# Patient Record
Sex: Male | Born: 1970 | Race: Black or African American | Hispanic: No | Marital: Single | Smoking: Never smoker
Health system: Southern US, Community
[De-identification: ages and names within clinical notes are randomized; demographics above are authoritative.]

---

## 2019-05-10 ENCOUNTER — Encounter (HOSPITAL_COMMUNITY): Payer: Self-pay | Admitting: Emergency Medicine

## 2019-05-10 ENCOUNTER — Emergency Department (HOSPITAL_COMMUNITY)
Admission: EM | Admit: 2019-05-10 | Discharge: 2019-05-10 | Disposition: A | Discharge status: Home / Self Care | Payer: Self-pay | Attending: Emergency Medicine | Admitting: Emergency Medicine

## 2019-05-10 ENCOUNTER — Emergency Department (HOSPITAL_COMMUNITY): Payer: Self-pay

## 2019-05-10 ENCOUNTER — Other Ambulatory Visit: Payer: Self-pay

## 2019-05-10 DIAGNOSIS — R079 Chest pain, unspecified: Secondary | ICD-10-CM

## 2019-05-10 DIAGNOSIS — R0789 Other chest pain: Secondary | ICD-10-CM | POA: Insufficient documentation

## 2019-05-10 LAB — COMPREHENSIVE METABOLIC PANEL
ALT: 38 U/L (ref 0–44)
AST: 39 U/L (ref 15–41)
Albumin: 3.7 g/dL (ref 3.5–5.0)
Alkaline Phosphatase: 69 U/L (ref 38–126)
Anion gap: 9 (ref 5–15)
BUN: 15 mg/dL (ref 6–20)
CO2: 25 mmol/L (ref 22–32)
Calcium: 9.5 mg/dL (ref 8.9–10.3)
Chloride: 106 mmol/L (ref 98–111)
Creatinine, Ser: 1.33 mg/dL — ABNORMAL HIGH (ref 0.61–1.24)
GFR calc Af Amer: >60 mL/min (ref >60)
GFR calc non Af Amer: >60 mL/min (ref >60)
Glucose, Bld: 99 mg/dL (ref 70–99)
Potassium: 4.4 mmol/L (ref 3.5–5.1)
Sodium: 140 mmol/L (ref 135–145)
Total Bilirubin: 1.8 mg/dL — ABNORMAL HIGH (ref 0.3–1.2)
Total Protein: 6.7 g/dL (ref 6.5–8.1)

## 2019-05-10 LAB — CBC
HCT: 41.9 % (ref 39.0–52.0)
Hemoglobin: 14 g/dL (ref 13.0–17.0)
MCH: 31.8 pg (ref 26.0–34.0)
MCHC: 33.4 g/dL (ref 30.0–36.0)
MCV: 95.2 fL (ref 80.0–100.0)
Platelets: 232 10*3/uL (ref 150–400)
RBC: 4.4 MIL/uL (ref 4.22–5.81)
RDW: 11.9 % (ref 11.5–15.5)
WBC: 4.9 10*3/uL (ref 4.0–10.5)
nRBC: 0 % (ref 0.0–0.2)

## 2019-05-10 LAB — TROPONIN I (HIGH SENSITIVITY)
Troponin I (High Sensitivity): 4 ng/L (ref <18)
Troponin I (High Sensitivity): 6 ng/L (ref <18)

## 2019-05-10 NOTE — Discharge Instructions (Addendum)
Please return to the emergency department if you have worsening chest pain, shortness of breath, or are worse at any time

## 2019-05-10 NOTE — ED Triage Notes (Signed)
Pt here for intermittent bilateral shoulder and chest wall pain with headache since 8:30 today while standing in line to purchase a car. Hx shoulder injury, doing PT for same. Pain worse with movement. Pt concerned he's been poisoned. Denies n/v/d.

## 2019-05-10 NOTE — ED Provider Notes (Signed)
[facility] EMERGENCY DEPARTMENT Provider Note   CSN: [id number] Arrival date & time: 05/10/19  1050     History Chief Complaint  Patient presents with  . Chest Pain    Dylan Sparks is a 49 y.o. male.  HPI    49 year old male presents today complaining of sharp anterior chest pain.  He states that it comes and goes lasting only seconds.  He was at the car auction today when he began having some sharp pain.  He denies any fever, chills, DVT, leg swelling, PE, or previous cardiac risk factors or known coronary artery disease. History reviewed. No pertinent past medical history.  There are no problems to display for this patient.   History reviewed. No pertinent surgical history.     No family history on file.  Social History   Tobacco Use  . Smoking status: Never Smoker  . Smokeless tobacco: Never Used  Substance Use Topics  . Alcohol use: Yes    Comment: one drink every other day  . Drug use: Never    Home Medications Prior to Admission medications   Medication Sig Start Date End Date Taking? Authorizing Provider  diclofenac (VOLTAREN) 75 MG EC tablet Take 75 mg by mouth 2 (two) times daily. 04/08/19  Yes [provider]  methocarbamol (ROBAXIN) 750 MG tablet Take 750 mg by mouth 4 (four) times daily. 04/08/19  Yes [provider]  triamcinolone ointment (KENALOG) 0.5 % Apply 1 application topically 2 (two) times daily as needed for rash. 04/17/19  Yes [provider]    Allergies    Shellfish allergy  Review of Systems   Review of Systems  All other systems reviewed and are negative.   Physical Exam Updated Vital Signs BP 126/71   Pulse 73   Temp 98.6 F (37 C) (Oral)   Resp 14   SpO2 95%   Physical Exam Vitals and nursing note reviewed.  Constitutional:      Appearance: He is well-developed.  HENT:     Head: Normocephalic and atraumatic.     Right Ear: External ear normal.     Left Ear: External  ear normal.     Nose: Nose normal.  Eyes:     Conjunctiva/sclera: Conjunctivae normal.     Pupils: Pupils are equal, round, and reactive to light.  Cardiovascular:     Rate and Rhythm: Normal rate and regular rhythm.     Heart sounds: Normal heart sounds.  Pulmonary:     Effort: Pulmonary effort is normal. No respiratory distress.     Breath sounds: Normal breath sounds. No wheezing.  Chest:     Chest wall: No tenderness.  Abdominal:     General: Bowel sounds are normal. There is no distension.     Palpations: Abdomen is soft. There is no mass.     Tenderness: There is no abdominal tenderness. There is no guarding.  Musculoskeletal:        General: Normal range of motion.     Cervical back: Normal range of motion and neck supple.  Skin:    General: Skin is warm and dry.  Neurological:     Mental Status: He is alert and oriented to person, place, and time.     Motor: No abnormal muscle tone.     Coordination: Coordination normal.     Deep Tendon Reflexes: Reflexes are normal and symmetric.  Psychiatric:        Behavior: Behavior normal.  Thought Content: Thought content normal.        Judgment: Judgment normal.     ED Results / Procedures / Treatments   Labs (all labs ordered are listed, but only abnormal results are displayed) Labs Reviewed  COMPREHENSIVE METABOLIC PANEL - Abnormal; Notable for the following components:      Result Value   Creatinine, Ser 1.33 (*)    Total Bilirubin 1.8 (*)    All other components within normal limits  CBC  TROPONIN I (HIGH SENSITIVITY)  TROPONIN I (HIGH SENSITIVITY)    EKG EKG Interpretation  Date/Time:  Wednesday May 10 2019 10:56:17 EDT Ventricular Rate:  84 PR Interval:    QRS Duration: 85 QT Interval:  353 QTC Calculation: 418 R Axis:   76 Text Interpretation: Sinus arrhythmia Probable left atrial enlargement Probable left ventricular hypertrophy Confirmed by Pattricia Boss [phone]) on 05/10/2019 2:23:54  PM   Radiology DG Chest Port 1 View  Result Date: 05/10/2019 CLINICAL DATA:  Pt states moving pain all over body, was unable to swallow for a while earlier Stated that he ate some questionable fish at 4 am Pt here for intermittent bilateral shoulder and chest wall pain with headache since 8:30 today EXAM: PORTABLE CHEST 1 VIEW COMPARISON:  None. FINDINGS: Normal mediastinum and cardiac silhouette. Normal pulmonary vasculature. No evidence of effusion, infiltrate, or pneumothorax. No acute bony abnormality. IMPRESSION: No acute cardiopulmonary process. Electronically Signed   By: Suzy Bouchard M.D.   On: 05/10/2019 11:17    Procedures Procedures (including critical care time)  Medications Ordered in ED Medications - No data to display  ED Course  I have reviewed the triage vital signs and the nursing notes.  Pertinent labs & imaging results that were available during my care of the patient were reviewed by me and considered in my medical decision making (see chart for details).    MDM Rules/Calculators/A&P                       This patient complains of chest pain, this involves an extensive number of treatment options, and is a complaint that carries with it a high risk of complications and morbidity.  The differential diagnosis includes acs,mi, dissection, intrapulmonary lung disease and gi disorders   I Ordered, reviewed, and interpreted labs, which included cbc, bmet, trop x 2    I ordered imaging studies which included cxr   I independently visualized and interpreted imaging which showed no acute disease      After the interventions stated above, I reevaluated the patient and found patient appears stable for discharge   Final Clinical Impression(s) / ED Diagnoses Final diagnoses:  Nonspecific chest pain    Rx / DC Orders ED Discharge Orders    None       Pattricia Boss, MD 05/10/19 1431

## 2021-01-24 IMAGING — DX DG CHEST 1V PORT
1 series · 1 of 1 positions shown · non-contrast
Comparison: None.

CLINICAL DATA: Pt states moving pain all over body, was unable to
swallow for a while earlier Stated that he ate some questionable
fish at 4 am Pt here for intermittent bilateral shoulder and chest
wall pain with headache since [DATE] today

EXAM:
PORTABLE CHEST 1 VIEW

[chest]
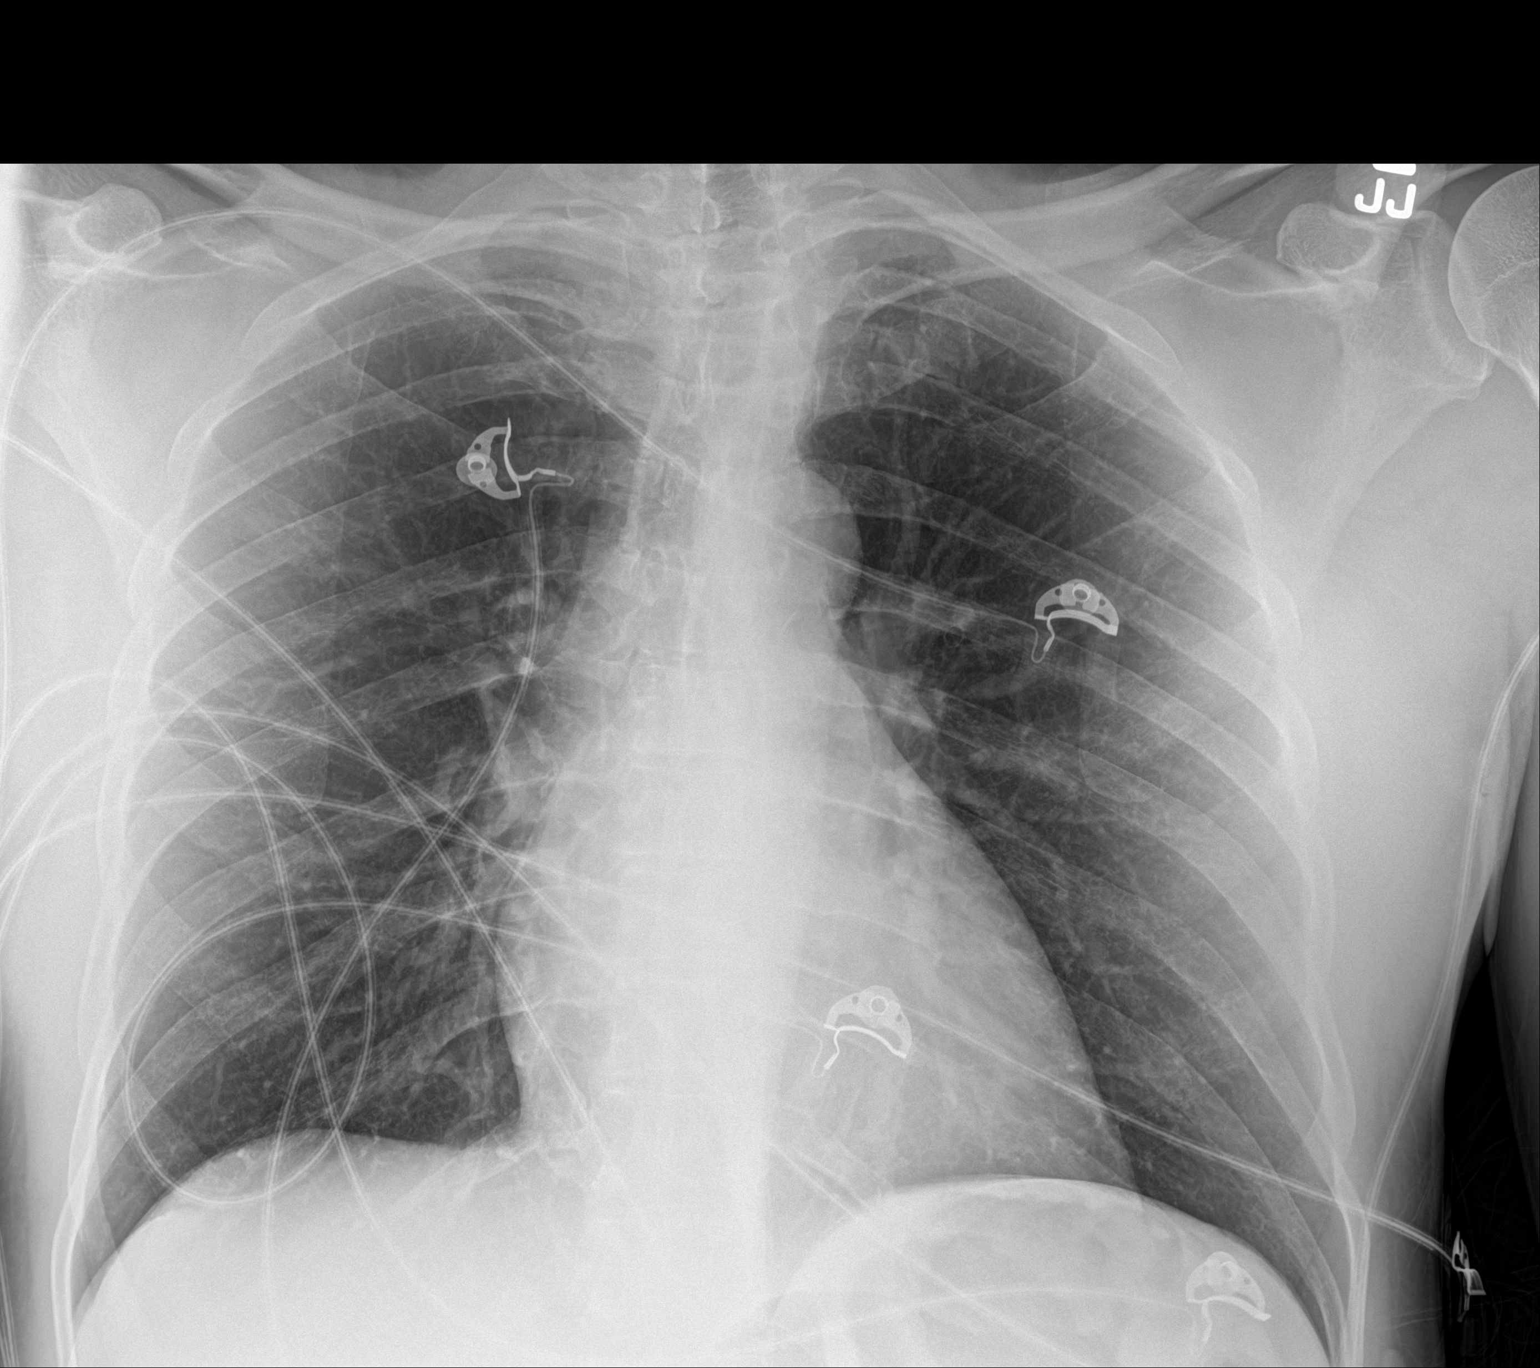

[1 of 1 positions shown; findings below may reference images not displayed]

FINDINGS: Normal mediastinum and cardiac silhouette. Normal pulmonary
vasculature. No evidence of effusion, infiltrate, or pneumothorax.
No acute bony abnormality.
IMPRESSION: No acute cardiopulmonary process.
